# Patient Record
Sex: Female | Born: 1935 | Race: White | Hispanic: No | Marital: Married
Health system: Southern US, Community
[De-identification: ages and names within clinical notes are randomized; demographics above are authoritative.]

## PROBLEM LIST (undated history)

## (undated) DIAGNOSIS — J189 Pneumonia, unspecified organism: Secondary | ICD-10-CM

## (undated) DIAGNOSIS — S12000A Unspecified displaced fracture of first cervical vertebra, initial encounter for closed fracture: Secondary | ICD-10-CM

## (undated) DIAGNOSIS — T886XXD Anaphylactic reaction due to adverse effect of correct drug or medicament properly administered, subsequent encounter: Secondary | ICD-10-CM

## (undated) DIAGNOSIS — J9621 Acute and chronic respiratory failure with hypoxia: Secondary | ICD-10-CM

---

## 2019-12-08 ENCOUNTER — Institutional Professional Consult (permissible substitution) (HOSPITAL_COMMUNITY): Payer: Medicare Other

## 2019-12-08 ENCOUNTER — Other Ambulatory Visit (HOSPITAL_COMMUNITY): Payer: Medicare Other

## 2019-12-08 ENCOUNTER — Inpatient Hospital Stay
Admission: RE | Admit: 2019-12-08 | Discharge: 2020-01-01 | Disposition: A | Discharge status: Skilled Nursing Facility | Payer: Medicare Other | Source: Other Acute Inpatient Hospital | Attending: Internal Medicine | Admitting: Internal Medicine

## 2019-12-08 DIAGNOSIS — Z4659 Encounter for fitting and adjustment of other gastrointestinal appliance and device: Secondary | ICD-10-CM

## 2019-12-08 DIAGNOSIS — J189 Pneumonia, unspecified organism: Secondary | ICD-10-CM | POA: Diagnosis present

## 2019-12-08 DIAGNOSIS — Z9181 History of falling: Secondary | ICD-10-CM

## 2019-12-08 DIAGNOSIS — T886XXD Anaphylactic reaction due to adverse effect of correct drug or medicament properly administered, subsequent encounter: Secondary | ICD-10-CM | POA: Diagnosis present

## 2019-12-08 DIAGNOSIS — K819 Cholecystitis, unspecified: Secondary | ICD-10-CM

## 2019-12-08 DIAGNOSIS — Z978 Presence of other specified devices: Secondary | ICD-10-CM

## 2019-12-08 DIAGNOSIS — J9621 Acute and chronic respiratory failure with hypoxia: Secondary | ICD-10-CM | POA: Diagnosis present

## 2019-12-08 DIAGNOSIS — S12000A Unspecified displaced fracture of first cervical vertebra, initial encounter for closed fracture: Secondary | ICD-10-CM | POA: Diagnosis present

## 2019-12-08 HISTORY — DX: Unspecified displaced fracture of first cervical vertebra, initial encounter for closed fracture: S12.000A

## 2019-12-08 HISTORY — DX: Pneumonia, unspecified organism: J18.9

## 2019-12-08 HISTORY — DX: Acute and chronic respiratory failure with hypoxia: J96.21

## 2019-12-08 HISTORY — DX: Anaphylactic reaction due to adverse effect of correct drug or medicament properly administered, subsequent encounter: T88.6XXD

## 2019-12-09 LAB — CBC
HCT: 26.1 % — ABNORMAL LOW (ref 36.0–46.0)
Hemoglobin: 8.3 g/dL — ABNORMAL LOW (ref 12.0–15.0)
MCH: 32.4 pg (ref 26.0–34.0)
MCHC: 31.8 g/dL (ref 30.0–36.0)
MCV: 102 fL — ABNORMAL HIGH (ref 80.0–100.0)
Platelets: 320 10*3/uL (ref 150–400)
RBC: 2.56 MIL/uL — ABNORMAL LOW (ref 3.87–5.11)
RDW: 15.3 % (ref 11.5–15.5)
WBC: 5.9 10*3/uL (ref 4.0–10.5)
nRBC: 0 % (ref 0.0–0.2)

## 2019-12-09 LAB — BASIC METABOLIC PANEL
Anion gap: 9 (ref 5–15)
BUN: 22 mg/dL (ref 8–23)
CO2: 25 mmol/L (ref 22–32)
Calcium: 8.2 mg/dL — ABNORMAL LOW (ref 8.9–10.3)
Chloride: 106 mmol/L (ref 98–111)
Creatinine, Ser: 0.52 mg/dL (ref 0.44–1.00)
GFR calc Af Amer: >60 mL/min (ref >60)
GFR calc non Af Amer: >60 mL/min (ref >60)
Glucose, Bld: 103 mg/dL — ABNORMAL HIGH (ref 70–99)
Potassium: 3.8 mmol/L (ref 3.5–5.1)
Sodium: 140 mmol/L (ref 135–145)

## 2019-12-09 LAB — COMPREHENSIVE METABOLIC PANEL
ALT: 26 U/L (ref 0–44)
AST: 30 U/L (ref 15–41)
Albumin: 2.4 g/dL — ABNORMAL LOW (ref 3.5–5.0)
Alkaline Phosphatase: 113 U/L (ref 38–126)
Anion gap: 9 (ref 5–15)
BUN: 20 mg/dL (ref 8–23)
CO2: 26 mmol/L (ref 22–32)
Calcium: 8.5 mg/dL — ABNORMAL LOW (ref 8.9–10.3)
Chloride: 106 mmol/L (ref 98–111)
Creatinine, Ser: 0.85 mg/dL (ref 0.44–1.00)
GFR calc Af Amer: >60 mL/min (ref >60)
GFR calc non Af Amer: >60 mL/min (ref >60)
Glucose, Bld: 109 mg/dL — ABNORMAL HIGH (ref 70–99)
Potassium: 3.7 mmol/L (ref 3.5–5.1)
Sodium: 141 mmol/L (ref 135–145)
Total Bilirubin: 0.9 mg/dL (ref 0.3–1.2)
Total Protein: 4.7 g/dL — ABNORMAL LOW (ref 6.5–8.1)

## 2019-12-09 LAB — CBC WITH DIFFERENTIAL/PLATELET
Abs Immature Granulocytes: 0.02 10*3/uL (ref 0.00–0.07)
Basophils Absolute: 0.1 10*3/uL (ref 0.0–0.1)
Basophils Relative: 1 %
Eosinophils Absolute: 0.1 10*3/uL (ref 0.0–0.5)
Eosinophils Relative: 2 %
HCT: 27.3 % — ABNORMAL LOW (ref 36.0–46.0)
Hemoglobin: 8.5 g/dL — ABNORMAL LOW (ref 12.0–15.0)
Immature Granulocytes: 0 %
Lymphocytes Relative: 16 %
Lymphs Abs: 0.8 10*3/uL (ref 0.7–4.0)
MCH: 31.8 pg (ref 26.0–34.0)
MCHC: 31.1 g/dL (ref 30.0–36.0)
MCV: 102.2 fL — ABNORMAL HIGH (ref 80.0–100.0)
Monocytes Absolute: 0.6 10*3/uL (ref 0.1–1.0)
Monocytes Relative: 11 %
Neutro Abs: 3.5 10*3/uL (ref 1.7–7.7)
Neutrophils Relative %: 70 %
Platelets: 319 10*3/uL (ref 150–400)
RBC: 2.67 MIL/uL — ABNORMAL LOW (ref 3.87–5.11)
RDW: 15.1 % (ref 11.5–15.5)
WBC: 5.1 10*3/uL (ref 4.0–10.5)
nRBC: 0 % (ref 0.0–0.2)

## 2019-12-11 ENCOUNTER — Other Ambulatory Visit (HOSPITAL_COMMUNITY): Payer: Medicare Other

## 2019-12-11 ENCOUNTER — Encounter (HOSPITAL_COMMUNITY): Payer: Medicare Other | Admitting: Anesthesiology

## 2019-12-11 LAB — URINALYSIS, ROUTINE W REFLEX MICROSCOPIC
Bilirubin Urine: NEGATIVE
Glucose, UA: NEGATIVE mg/dL
Ketones, ur: NEGATIVE mg/dL
Nitrite: NEGATIVE
Protein, ur: 30 mg/dL — AB
Specific Gravity, Urine: 1.019 (ref 1.005–1.030)
WBC, UA: >50 WBC/hpf — ABNORMAL HIGH (ref 0–5)
pH: 5 (ref 5.0–8.0)

## 2019-12-11 LAB — BLOOD GAS, ARTERIAL
Acid-Base Excess: 0.3 mmol/L (ref 0.0–2.0)
Bicarbonate: 24.6 mmol/L (ref 20.0–28.0)
FIO2: 35
O2 Saturation: 98.5 %
Patient temperature: 36.3
pCO2 arterial: 39.7 mmHg (ref 32.0–48.0)
pH, Arterial: 7.405 (ref 7.350–7.450)
pO2, Arterial: 116 mmHg — ABNORMAL HIGH (ref 83.0–108.0)

## 2019-12-11 MED ORDER — NYSTATIN 100000 UNIT/ML MT SUSP
500000.00 | OROMUCOSAL | Status: DC
Start: 2019-12-09 — End: 2019-12-11

## 2019-12-11 MED ORDER — ACETAMINOPHEN 325 MG PO TABS
325.00 | ORAL_TABLET | ORAL | Status: DC
Start: 2019-12-09 — End: 2019-12-11

## 2019-12-11 MED ORDER — PROPOFOL 10 MG/ML IV BOLUS
INTRAVENOUS | Status: DC | PRN
  Administered 2019-12-11: 40 mg via INTRAVENOUS
  Administered 2019-12-11: 60 mg via INTRAVENOUS

## 2019-12-11 MED ORDER — PRO-STAT PO LIQD
ORAL | Status: DC
Start: 2019-12-09 — End: 2019-12-11

## 2019-12-11 MED ORDER — ASPIRIN 325 MG PO TABS
325.00 | ORAL_TABLET | ORAL | Status: DC
Start: 2019-12-09 — End: 2019-12-11

## 2019-12-11 MED ORDER — GENERIC EXTERNAL MEDICATION
Status: DC
Start: ? — End: 2019-12-11

## 2019-12-11 MED ORDER — CHOLECALCIFEROL 25 MCG (1000 UT) PO TABS
1000.00 | ORAL_TABLET | ORAL | Status: DC
Start: 2019-12-09 — End: 2019-12-11

## 2019-12-11 MED ORDER — TRAMADOL HCL 50 MG PO TABS
50.00 | ORAL_TABLET | ORAL | Status: DC
Start: ? — End: 2019-12-11

## 2019-12-11 MED ORDER — GENERIC EXTERNAL MEDICATION
12.50 | Status: DC
Start: 2019-12-08 — End: 2019-12-11

## 2019-12-11 MED ORDER — SENNOSIDES-DOCUSATE SODIUM 8.6-50 MG PO TABS
2.00 | ORAL_TABLET | ORAL | Status: DC
Start: 2019-12-08 — End: 2019-12-11

## 2019-12-11 MED ORDER — BACITRACIN ZINC 500 UNIT/GM EX OINT
TOPICAL_OINTMENT | CUTANEOUS | Status: DC
Start: 2019-12-08 — End: 2019-12-11

## 2019-12-11 MED ORDER — OXYCODONE-ACETAMINOPHEN 5-325 MG PO TABS
1.00 | ORAL_TABLET | ORAL | Status: DC
Start: ? — End: 2019-12-11

## 2019-12-11 MED ORDER — SUCCINYLCHOLINE CHLORIDE 20 MG/ML IJ SOLN
INTRAMUSCULAR | Status: DC | PRN
  Administered 2019-12-11: 100 mg via INTRAVENOUS

## 2019-12-11 MED ORDER — MELATONIN 3 MG PO TABS
3.00 | ORAL_TABLET | ORAL | Status: DC
Start: 2019-12-08 — End: 2019-12-11

## 2019-12-11 MED ORDER — ENOXAPARIN SODIUM 30 MG/0.3ML ~~LOC~~ SOLN
30.00 | SUBCUTANEOUS | Status: DC
Start: 2019-12-08 — End: 2019-12-11

## 2019-12-11 MED ORDER — METOPROLOL TARTRATE 5 MG/5ML IV SOLN
5.00 | INTRAVENOUS | Status: DC
Start: ? — End: 2019-12-11

## 2019-12-11 MED ORDER — POLYETHYLENE GLYCOL 3350 17 GM/SCOOP PO POWD
17.00 | ORAL | Status: DC
Start: 2019-12-09 — End: 2019-12-11

## 2019-12-11 MED ORDER — BISACODYL 10 MG RE SUPP
10.00 | RECTAL | Status: DC
Start: ? — End: 2019-12-11

## 2019-12-11 NOTE — Anesthesia Procedure Notes (Signed)
Procedure Name: Intubation Date/Time: 12/11/2019 3:04 AM Performed by: Suzy Bouchard, CRNA Pre-anesthesia Checklist: Patient identified, Emergency Drugs available, Suction available, Patient being monitored and Timeout performed Patient Re-evaluated:Patient Re-evaluated prior to induction Oxygen Delivery Method: Circle system utilized Preoxygenation: Pre-oxygenation with 100% oxygen Induction Type: IV induction and Rapid sequence Ventilation: Mask ventilation without difficulty Laryngoscope Size: Glidescope and 3 Grade View: Grade III Tube type: Oral Tube size: 7.5 mm Number of attempts: 4 Airway Equipment and Method: Stylet and Video-laryngoscopy Placement Confirmation: ETT inserted through vocal cords under direct vision,  CO2 detector and breath sounds checked- equal and bilateral Secured at: 23 cm Tube secured with: Tape Dental Injury: Teeth and Oropharynx as per pre-operative assessment  Difficulty Due To: Difficult Airway- due to limited oral opening, Difficult Airway- due to cervical collar and Difficult Airway- due to reduced neck mobility Comments: Asked to intubate Ms. Kem after a suspected allergic reaction.  Patient in cervical collar / s/p cervical surgery.  In-line manual stabilization held by RN/RT.  No neck mobility.  Several glidescope attempts w 4 blade by AAuston CRNA, no view.  Bagged patient with 100% Fi02 after two attempts; sats remained 95 and above.  Successfully intubated by Dr. Ola Spurr with glidescope 3, grade 3 view.  Placed on vent by RT.

## 2019-12-12 ENCOUNTER — Encounter: Payer: Self-pay | Admitting: Internal Medicine

## 2019-12-12 DIAGNOSIS — T886XXD Anaphylactic reaction due to adverse effect of correct drug or medicament properly administered, subsequent encounter: Secondary | ICD-10-CM | POA: Diagnosis present

## 2019-12-12 DIAGNOSIS — J9621 Acute and chronic respiratory failure with hypoxia: Secondary | ICD-10-CM | POA: Diagnosis present

## 2019-12-12 DIAGNOSIS — S12001D Unspecified nondisplaced fracture of first cervical vertebra, subsequent encounter for fracture with routine healing: Secondary | ICD-10-CM | POA: Diagnosis not present

## 2019-12-12 DIAGNOSIS — Z978 Presence of other specified devices: Secondary | ICD-10-CM

## 2019-12-12 DIAGNOSIS — J189 Pneumonia, unspecified organism: Secondary | ICD-10-CM | POA: Diagnosis not present

## 2019-12-12 DIAGNOSIS — S12000A Unspecified displaced fracture of first cervical vertebra, initial encounter for closed fracture: Secondary | ICD-10-CM | POA: Diagnosis present

## 2019-12-12 LAB — CBC
HCT: 26.1 % — ABNORMAL LOW (ref 36.0–46.0)
Hemoglobin: 8.2 g/dL — ABNORMAL LOW (ref 12.0–15.0)
MCH: 31.8 pg (ref 26.0–34.0)
MCHC: 31.4 g/dL (ref 30.0–36.0)
MCV: 101.2 fL — ABNORMAL HIGH (ref 80.0–100.0)
Platelets: 320 10*3/uL (ref 150–400)
RBC: 2.58 MIL/uL — ABNORMAL LOW (ref 3.87–5.11)
RDW: 15.2 % (ref 11.5–15.5)
WBC: 8.2 10*3/uL (ref 4.0–10.5)
nRBC: 0 % (ref 0.0–0.2)

## 2019-12-12 LAB — BASIC METABOLIC PANEL
Anion gap: 10 (ref 5–15)
BUN: 37 mg/dL — ABNORMAL HIGH (ref 8–23)
CO2: 25 mmol/L (ref 22–32)
Calcium: 8.4 mg/dL — ABNORMAL LOW (ref 8.9–10.3)
Chloride: 107 mmol/L (ref 98–111)
Creatinine, Ser: 0.67 mg/dL (ref 0.44–1.00)
GFR calc Af Amer: >60 mL/min (ref >60)
GFR calc non Af Amer: >60 mL/min (ref >60)
Glucose, Bld: 137 mg/dL — ABNORMAL HIGH (ref 70–99)
Potassium: 4.2 mmol/L (ref 3.5–5.1)
Sodium: 142 mmol/L (ref 135–145)

## 2019-12-12 NOTE — Consult Note (Signed)
[facility] [place]  PULMONARY SERVICE  Date of Service: 12/12/2019  PULMONARY CRITICAL CARE CONSULT   Dawn Figueroa  AYT:[id number]  DOB: [birth year]   DOA: 12/08/2019  Referring Physician: Merton Border, MD  HPI: Dawn Figueroa is a 85 y.o. female seen for follow up of Acute on Chronic Respiratory Failure.  Patient has multiple medical problems apparently suffered a fall and sustained multiple injuries including a C1 fracture neurosurgery saw the patient at that time with supportive recommended nonoperative therapy.  Patient however subsequently went to the OR and had an internal fixation of C1-2 fracture dislocation.  Postoperatively patient ended up getting extubated however had a pneumonia was treated with broad-spectrum antibiotics.  The patient appeared to do okay was transferred to this facility for further management and weaning.  Now overnight apparently patient has anaphylactic type reaction with throat swelling to some type of medication and had to be emergently intubated she is now on the ventilator but is doing better.  She is adequately sedated right now also has been switched over to pressure support to see if she is ready for weaning.  Review of Systems:  ROS performed and is unremarkable other than noted above.  Past medical history: Delirium Pneumonia Constipation  Past surgical history: Repair of the C1-2 fracture Intubation Ear laceration  Family history: Noncontributory  Social history: Unknown about tobacco alcohol or drug abuse  Medications: Reviewed on Rounds  Physical Exam:  Vitals: Temperature 97.9 pulse 97 respiratory 27 blood pressure 156/78 saturations 100%  Ventilator Settings mode ventilation pressure support FiO2 28% pressure 12 PEEP 5  . General: Comfortable at this time . Eyes: Grossly normal lids, irises & conjunctiva . ENT: grossly tongue is normal . Neck: no obvious mass . Cardiovascular: S1-S2  normal no gallop or rub . Respiratory: No rhonchi coarse breath sounds are noted . Abdomen: Soft and nontender at this time . Skin: no rash seen on limited exam . Musculoskeletal: not rigid . Psychiatric:unable to assess . Neurologic: no seizure no involuntary movements         Labs on Admission:  Basic Metabolic Panel: Recent Labs  Lab 12/08/19 2339 12/09/19 0548 12/12/19 0513  NA 140 141 142  K 3.8 3.7 4.2  CL 106 106 107  CO2 25 26 25   GLUCOSE 103* 109* 137*  BUN 22 20 37*  CREATININE 0.52 0.85 0.67  CALCIUM 8.2* 8.5* 8.4*    Recent Labs  Lab 12/11/19 0420  PHART 7.405  PCO2ART 39.7  PO2ART 116*  HCO3 24.6  O2SAT 98.5    Liver Function Tests: Recent Labs  Lab 12/09/19 0548  AST 30  ALT 26  ALKPHOS 113  BILITOT 0.9  PROT 4.7*  ALBUMIN 2.4*   No results for input(s): LIPASE, AMYLASE in the last 168 hours. No results for input(s): AMMONIA in the last 168 hours.  CBC: Recent Labs  Lab 12/08/19 2339 12/09/19 0548 12/12/19 0513  WBC 5.9 5.1 8.2  NEUTROABS  --  3.5  --   HGB 8.3* 8.5* 8.2*  HCT 26.1* 27.3* 26.1*  MCV 102.0* 102.2* 101.2*  PLT 320 319 320    Cardiac Enzymes: No results for input(s): CKTOTAL, CKMB, CKMBINDEX, TROPONINI in the last 168 hours.  BNP (last 3 results) No results for input(s): BNP in the last 8760 hours.  ProBNP (last 3 results) No results for input(s): PROBNP in the last 8760 hours.   Radiological Exams on Admission: DG CHEST PORT 1 VIEW  Result  Date: 12/11/2019 CLINICAL DATA:  Intubation EXAM: PORTABLE CHEST 1 VIEW COMPARISON:  12/08/2019 FINDINGS: Endotracheal tube tip is 2 cm above the inferior margin of the carina. Retraction by 2-3 cm would place it at the level of the clavicular heads. Orogastric tube courses below the field of view. No focal airspace consolidation. Cardiomediastinal contours are normal. IMPRESSION: Endotracheal tube tip 2 cm above the inferior margin of the carina. Retraction by 2-3 cm would  place it at the level of the clavicular heads. Electronically Signed   By: Deatra Robinson M.D.   On: 12/11/2019 03:40   DG CHEST PORT 1 VIEW  Result Date: 12/08/2019 CLINICAL DATA:  Status post fall. EXAM: PORTABLE CHEST 1 VIEW COMPARISON:  None. FINDINGS: Weighted enteric tube is in place, tip below the diaphragm not included in the field of view. The lungs are hyperinflated. Small bilateral pleural effusions. Minor left lung base atelectasis. Heart is normal in size. Normal mediastinal contours with aortic atherosclerosis. No pneumothorax. No pulmonary edema. No acute osseous abnormalities are seen. Chronic change of both shoulders. Bones are under mineralized. Skin staples in the cervical spine partially included. IMPRESSION: 1. Hyperinflation suggesting underlying COPD. 2. Small bilateral pleural effusions. 3.  Aortic Atherosclerosis (ICD10-I70.0). Electronically Signed   By: Narda Rutherford M.D.   On: 12/08/2019 23:07   DG Abd Portable 1V  Result Date: 12/08/2019 CLINICAL DATA:  Feeding tube placement EXAM: PORTABLE ABDOMEN - 1 VIEW COMPARISON:  None. FINDINGS: Feeding tube tip is in the descending duodenum. Nonobstructive bowel gas pattern. No organomegaly or free air. IMPRESSION: Feeding tube tip in the descending duodenum. Electronically Signed   By: Charlett Nose M.D.   On: 12/08/2019 22:03    Assessment/Plan Active Problems:   Acute on chronic respiratory failure with hypoxia (HCC)   Community acquired pneumonia   Anaphylactic reaction due to adverse effect of correct drug or medicament properly administered, subsequent encounter   Closed C1 fracture (HCC)   1. Acute on chronic respiratory failure hypoxia probably an acute anaphylactic reaction patient had to be intubated now is doing better so we are going to try to wean her sedation off currently is on propofol.  We will try to get her weaned and then now plan on extubation by morning 2. History of community-acquired pneumonia  treated at the other facility we will continue with supportive care last chest x-ray showing some hyperinflation small effusions 3. Anaphylaxis currently in recovery will continue to monitor closely hold off on extubation until we assess her again tomorrow. 4. C1-2 fracture status post surgical repair supportive care  I have personally seen and evaluated the patient, evaluated laboratory and imaging results, formulated the assessment and plan and placed orders.  Patient is critically ill in danger of cardiac arrest and death requiring complex sedation medications as well as high risk airway remains orally intubated The Patient requires high complexity decision making for assessment and support.  Case was discussed on Rounds with the Respiratory Therapy Staff Time Spent  Yevonne Pax, MD [facility] Pulmonary Critical Care Medicine Sleep Medicine

## 2019-12-13 DIAGNOSIS — T886XXD Anaphylactic reaction due to adverse effect of correct drug or medicament properly administered, subsequent encounter: Secondary | ICD-10-CM | POA: Diagnosis not present

## 2019-12-13 DIAGNOSIS — J9621 Acute and chronic respiratory failure with hypoxia: Secondary | ICD-10-CM | POA: Diagnosis not present

## 2019-12-13 DIAGNOSIS — J189 Pneumonia, unspecified organism: Secondary | ICD-10-CM | POA: Diagnosis not present

## 2019-12-13 DIAGNOSIS — S12001D Unspecified nondisplaced fracture of first cervical vertebra, subsequent encounter for fracture with routine healing: Secondary | ICD-10-CM | POA: Diagnosis not present

## 2019-12-13 LAB — URINE CULTURE: Culture: >=100000 — AB

## 2019-12-13 NOTE — Progress Notes (Signed)
Pulmonary Critical Care Medicine [place]   PULMONARY CRITICAL CARE SERVICE  PROGRESS NOTE  Date of Service: 12/13/2019  Dawn Figueroa  NLG:[id number]  DOB: 06-03-1931   DOA: 12/08/2019  Referring Physician: Merton Border, MD  HPI: Dawn Figueroa is a 85 y.o. female seen for follow up of Acute on Chronic Respiratory Failure.  Patient currently is on full support on pressure support is on mild 28% FiO2 with good saturations noted  Medications: Reviewed on Rounds  Physical Exam:  Vitals: Temperature 97.1 pulse 79 respiratory rate 17 blood pressure 138/67 saturations 100%  Ventilator Settings mode of ventilation pressure support FiO2 28% tidal volume is 500 pressure poor 12 PEEP 5  . General: Comfortable at this time . Eyes: Grossly normal lids, irises & conjunctiva . ENT: grossly tongue is normal . Neck: no obvious mass . Cardiovascular: S1 S2 normal no gallop . Respiratory: Scattered rhonchi expansion is equal . Abdomen: soft . Skin: no rash seen on limited exam . Musculoskeletal: not rigid . Psychiatric:unable to assess . Neurologic: no seizure no involuntary movements         Lab Data:   Basic Metabolic Panel: Recent Labs  Lab 12/08/19 2339 12/09/19 0548 12/12/19 0513  NA 140 141 142  K 3.8 3.7 4.2  CL 106 106 107  CO2 25 26 25   GLUCOSE 103* 109* 137*  BUN 22 20 37*  CREATININE 0.52 0.85 0.67  CALCIUM 8.2* 8.5* 8.4*    ABG: Recent Labs  Lab 12/11/19 0420  PHART 7.405  PCO2ART 39.7  PO2ART 116*  HCO3 24.6  O2SAT 98.5    Liver Function Tests: Recent Labs  Lab 12/09/19 0548  AST 30  ALT 26  ALKPHOS 113  BILITOT 0.9  PROT 4.7*  ALBUMIN 2.4*   No results for input(s): LIPASE, AMYLASE in the last 168 hours. No results for input(s): AMMONIA in the last 168 hours.  CBC: Recent Labs  Lab 12/08/19 2339 12/09/19 0548 12/12/19 0513  WBC 5.9 5.1 8.2  NEUTROABS  --  3.5  --   HGB 8.3* 8.5* 8.2*  HCT 26.1* 27.3* 26.1*  MCV  102.0* 102.2* 101.2*  PLT 320 319 320    Cardiac Enzymes: No results for input(s): CKTOTAL, CKMB, CKMBINDEX, TROPONINI in the last 168 hours.  BNP (last 3 results) No results for input(s): BNP in the last 8760 hours.  ProBNP (last 3 results) No results for input(s): PROBNP in the last 8760 hours.  Radiological Exams: No results found.  Assessment/Plan Active Problems:   Acute on chronic respiratory failure with hypoxia (HCC)   Community acquired pneumonia   Anaphylactic reaction due to adverse effect of correct drug or medicament properly administered, subsequent encounter   Closed C1 fracture ([place])   1. Acute on chronic respiratory failure with hypoxia patient has remained pressure support overnight is doing very well.  Spoke with respiratory therapy during rounds the plan will be to do a cuff leak test evaluation to see if the patient is ready for trial of extubation.  If the patient passes this test then we will proceed to extubate 2. Anaphylactic reaction clinically improved we will continue with supportive care 3. Community-acquired pneumonia treated resolved 4. C1 fracture at baseline supportive care   I have personally seen and evaluated the patient, evaluated laboratory and imaging results, formulated the assessment and plan and placed orders.  Time 35 minutes extended discussion with respiratory therapy The Patient requires high complexity decision making for assessment and support.  Case was discussed on Rounds with the Respiratory Therapy Staff  Allyne Gee, MD [facility] Pulmonary Critical Care Medicine Sleep Medicine

## 2019-12-14 DIAGNOSIS — J9621 Acute and chronic respiratory failure with hypoxia: Secondary | ICD-10-CM | POA: Diagnosis not present

## 2019-12-14 DIAGNOSIS — S12001D Unspecified nondisplaced fracture of first cervical vertebra, subsequent encounter for fracture with routine healing: Secondary | ICD-10-CM | POA: Diagnosis not present

## 2019-12-14 DIAGNOSIS — T886XXD Anaphylactic reaction due to adverse effect of correct drug or medicament properly administered, subsequent encounter: Secondary | ICD-10-CM | POA: Diagnosis not present

## 2019-12-14 DIAGNOSIS — J189 Pneumonia, unspecified organism: Secondary | ICD-10-CM | POA: Diagnosis not present

## 2019-12-14 NOTE — Progress Notes (Signed)
Pulmonary Critical Care Medicine [facility] [place]   PULMONARY CRITICAL CARE SERVICE  PROGRESS NOTE  Date of Service: 12/14/2019  Dawn Figueroa  TIW:[id number]  DOB: May 01, 1931   DOA: 12/08/2019  Referring Physician: Carron Curie, MD  HPI: Dawn Figueroa is a 85 y.o. female seen for follow up of Acute on Chronic Respiratory Failure.  Patient was seen at bedside I did a cuff leak test on her and it turns out that she has a size 7.5 ET tube and she also has a very small body habitus.  She did have good leak around the cough and so therefore we will plan on extubating her however she will need to be monitored closely  Medications: Reviewed on Rounds  Physical Exam:  Vitals: Temperature 97.4 pulse 72 respiratory rate 19 blood pressure 160/87 saturations 93%  Ventilator Settings mode ventilation pressure support FiO2 28% tidal volume was 590 pressure support 12 PEEP 5  . General: Comfortable at this time . Eyes: Grossly normal lids, irises & conjunctiva . ENT: grossly tongue is normal . Neck: no obvious mass . Cardiovascular: S1 S2 normal no gallop . Respiratory: Coarse breath sounds with some few scattered rhonchi . Abdomen: soft . Skin: no rash seen on limited exam . Musculoskeletal: not rigid . Psychiatric:unable to assess . Neurologic: no seizure no involuntary movements         Lab Data:   Basic Metabolic Panel: Recent Labs  Lab 12/08/19 2339 12/09/19 0548 12/12/19 0513  NA 140 141 142  K 3.8 3.7 4.2  CL 106 106 107  CO2 25 26 25   GLUCOSE 103* 109* 137*  BUN 22 20 37*  CREATININE 0.52 0.85 0.67  CALCIUM 8.2* 8.5* 8.4*    ABG: Recent Labs  Lab 12/11/19 0420  PHART 7.405  PCO2ART 39.7  PO2ART 116*  HCO3 24.6  O2SAT 98.5    Liver Function Tests: Recent Labs  Lab 12/09/19 0548  AST 30  ALT 26  ALKPHOS 113  BILITOT 0.9  PROT 4.7*  ALBUMIN 2.4*   No results for input(s): LIPASE, AMYLASE in the last 168 hours. No results for input(s):  AMMONIA in the last 168 hours.  CBC: Recent Labs  Lab 12/08/19 2339 12/09/19 0548 12/12/19 0513  WBC 5.9 5.1 8.2  NEUTROABS  --  3.5  --   HGB 8.3* 8.5* 8.2*  HCT 26.1* 27.3* 26.1*  MCV 102.0* 102.2* 101.2*  PLT 320 319 320    Cardiac Enzymes: No results for input(s): CKTOTAL, CKMB, CKMBINDEX, TROPONINI in the last 168 hours.  BNP (last 3 results) No results for input(s): BNP in the last 8760 hours.  ProBNP (last 3 results) No results for input(s): PROBNP in the last 8760 hours.  Radiological Exams: No results found.  Assessment/Plan Active Problems:   Acute on chronic respiratory failure with hypoxia (HCC)   Community acquired pneumonia   Anaphylactic reaction due to adverse effect of correct drug or medicament properly administered, subsequent encounter   Closed C1 fracture (HCC)   1. Acute on chronic respiratory failure hypoxia the plan is to continue with advancing the wean plan on extubation today I believe she has passed her cuff leak test because of the size of her ET tube it may not be optimal plate due to the large size of ET tube and small airways I think it is going to be sufficient she is able to produce leak speech around the endotracheal tube 2. Community-acquired pneumonia treated we will continue with supportive care  3. Anaphylactic reaction resolved 4. C1 fracture patient is at baseline we will continue to monitor   I have personally seen and evaluated the patient, evaluated laboratory and imaging results, formulated the assessment and plan and placed orders.  Time is 35 minutes at the bedside and the discussion on rounds The Patient requires high complexity decision making for assessment and support.  Case was discussed on Rounds with the Respiratory Therapy Staff  Allyne Gee, MD [facility] Pulmonary Critical Care Medicine Sleep Medicine

## 2019-12-16 LAB — CBC
HCT: 31.4 % — ABNORMAL LOW (ref 36.0–46.0)
Hemoglobin: 10 g/dL — ABNORMAL LOW (ref 12.0–15.0)
MCH: 31 pg (ref 26.0–34.0)
MCHC: 31.8 g/dL (ref 30.0–36.0)
MCV: 97.2 fL (ref 80.0–100.0)
Platelets: 372 K/uL (ref 150–400)
RBC: 3.23 MIL/uL — ABNORMAL LOW (ref 3.87–5.11)
RDW: 14.6 % (ref 11.5–15.5)
WBC: 6.4 K/uL (ref 4.0–10.5)
nRBC: 0 % (ref 0.0–0.2)

## 2019-12-16 LAB — BASIC METABOLIC PANEL
Anion gap: 11 (ref 5–15)
BUN: 19 mg/dL (ref 8–23)
CO2: 21 mmol/L — ABNORMAL LOW (ref 22–32)
Calcium: 8.6 mg/dL — ABNORMAL LOW (ref 8.9–10.3)
Chloride: 107 mmol/L (ref 98–111)
Creatinine, Ser: 0.54 mg/dL (ref 0.44–1.00)
GFR calc Af Amer: >60 mL/min (ref >60)
GFR calc non Af Amer: >60 mL/min (ref >60)
Glucose, Bld: 108 mg/dL — ABNORMAL HIGH (ref 70–99)
Potassium: 3.8 mmol/L (ref 3.5–5.1)
Sodium: 139 mmol/L (ref 135–145)

## 2019-12-20 ENCOUNTER — Other Ambulatory Visit (HOSPITAL_COMMUNITY): Payer: Medicare Other

## 2019-12-20 MED ORDER — GENERIC EXTERNAL MEDICATION
Status: DC
Start: ? — End: 2019-12-20

## 2019-12-23 LAB — CBC
HCT: 30.6 % — ABNORMAL LOW (ref 36.0–46.0)
Hemoglobin: 9.2 g/dL — ABNORMAL LOW (ref 12.0–15.0)
MCH: 30.1 pg (ref 26.0–34.0)
MCHC: 30.1 g/dL (ref 30.0–36.0)
MCV: 100 fL (ref 80.0–100.0)
Platelets: 302 10*3/uL (ref 150–400)
RBC: 3.06 MIL/uL — ABNORMAL LOW (ref 3.87–5.11)
RDW: 14.6 % (ref 11.5–15.5)
WBC: 5.7 10*3/uL (ref 4.0–10.5)
nRBC: 0 % (ref 0.0–0.2)

## 2019-12-23 LAB — COMPREHENSIVE METABOLIC PANEL
ALT: 15 U/L (ref 0–44)
AST: 18 U/L (ref 15–41)
Albumin: 2.3 g/dL — ABNORMAL LOW (ref 3.5–5.0)
Alkaline Phosphatase: 77 U/L (ref 38–126)
Anion gap: 9 (ref 5–15)
BUN: 50 mg/dL — ABNORMAL HIGH (ref 8–23)
CO2: 25 mmol/L (ref 22–32)
Calcium: 8.7 mg/dL — ABNORMAL LOW (ref 8.9–10.3)
Chloride: 109 mmol/L (ref 98–111)
Creatinine, Ser: 0.58 mg/dL (ref 0.44–1.00)
GFR calc Af Amer: >60 mL/min (ref >60)
GFR calc non Af Amer: >60 mL/min (ref >60)
Glucose, Bld: 94 mg/dL (ref 70–99)
Potassium: 4.3 mmol/L (ref 3.5–5.1)
Sodium: 143 mmol/L (ref 135–145)
Total Bilirubin: 0.3 mg/dL (ref 0.3–1.2)
Total Protein: 4.9 g/dL — ABNORMAL LOW (ref 6.5–8.1)

## 2019-12-30 LAB — CBC
HCT: 30.6 % — ABNORMAL LOW (ref 36.0–46.0)
Hemoglobin: 9.3 g/dL — ABNORMAL LOW (ref 12.0–15.0)
MCH: 29.1 pg (ref 26.0–34.0)
MCHC: 30.4 g/dL (ref 30.0–36.0)
MCV: 95.6 fL (ref 80.0–100.0)
Platelets: 286 10*3/uL (ref 150–400)
RBC: 3.2 MIL/uL — ABNORMAL LOW (ref 3.87–5.11)
RDW: 14.2 % (ref 11.5–15.5)
WBC: 3.2 10*3/uL — ABNORMAL LOW (ref 4.0–10.5)
nRBC: 0 % (ref 0.0–0.2)

## 2019-12-30 LAB — BASIC METABOLIC PANEL
Anion gap: 7 (ref 5–15)
BUN: 23 mg/dL (ref 8–23)
CO2: 28 mmol/L (ref 22–32)
Calcium: 8.9 mg/dL (ref 8.9–10.3)
Chloride: 109 mmol/L (ref 98–111)
Creatinine, Ser: 0.6 mg/dL (ref 0.44–1.00)
GFR calc Af Amer: >60 mL/min (ref >60)
GFR calc non Af Amer: >60 mL/min (ref >60)
Glucose, Bld: 105 mg/dL — ABNORMAL HIGH (ref 70–99)
Potassium: 4 mmol/L (ref 3.5–5.1)
Sodium: 144 mmol/L (ref 135–145)

## 2020-01-01 LAB — SARS CORONAVIRUS 2 (TAT 6-24 HRS): SARS Coronavirus 2: NEGATIVE

## 2021-04-22 IMAGING — DX DG CHEST 1V PORT
1 series · 1 of 1 positions shown · non-contrast
Comparison: None.

CLINICAL DATA: Status post fall.

EXAM:
PORTABLE CHEST 1 VIEW

[chest]
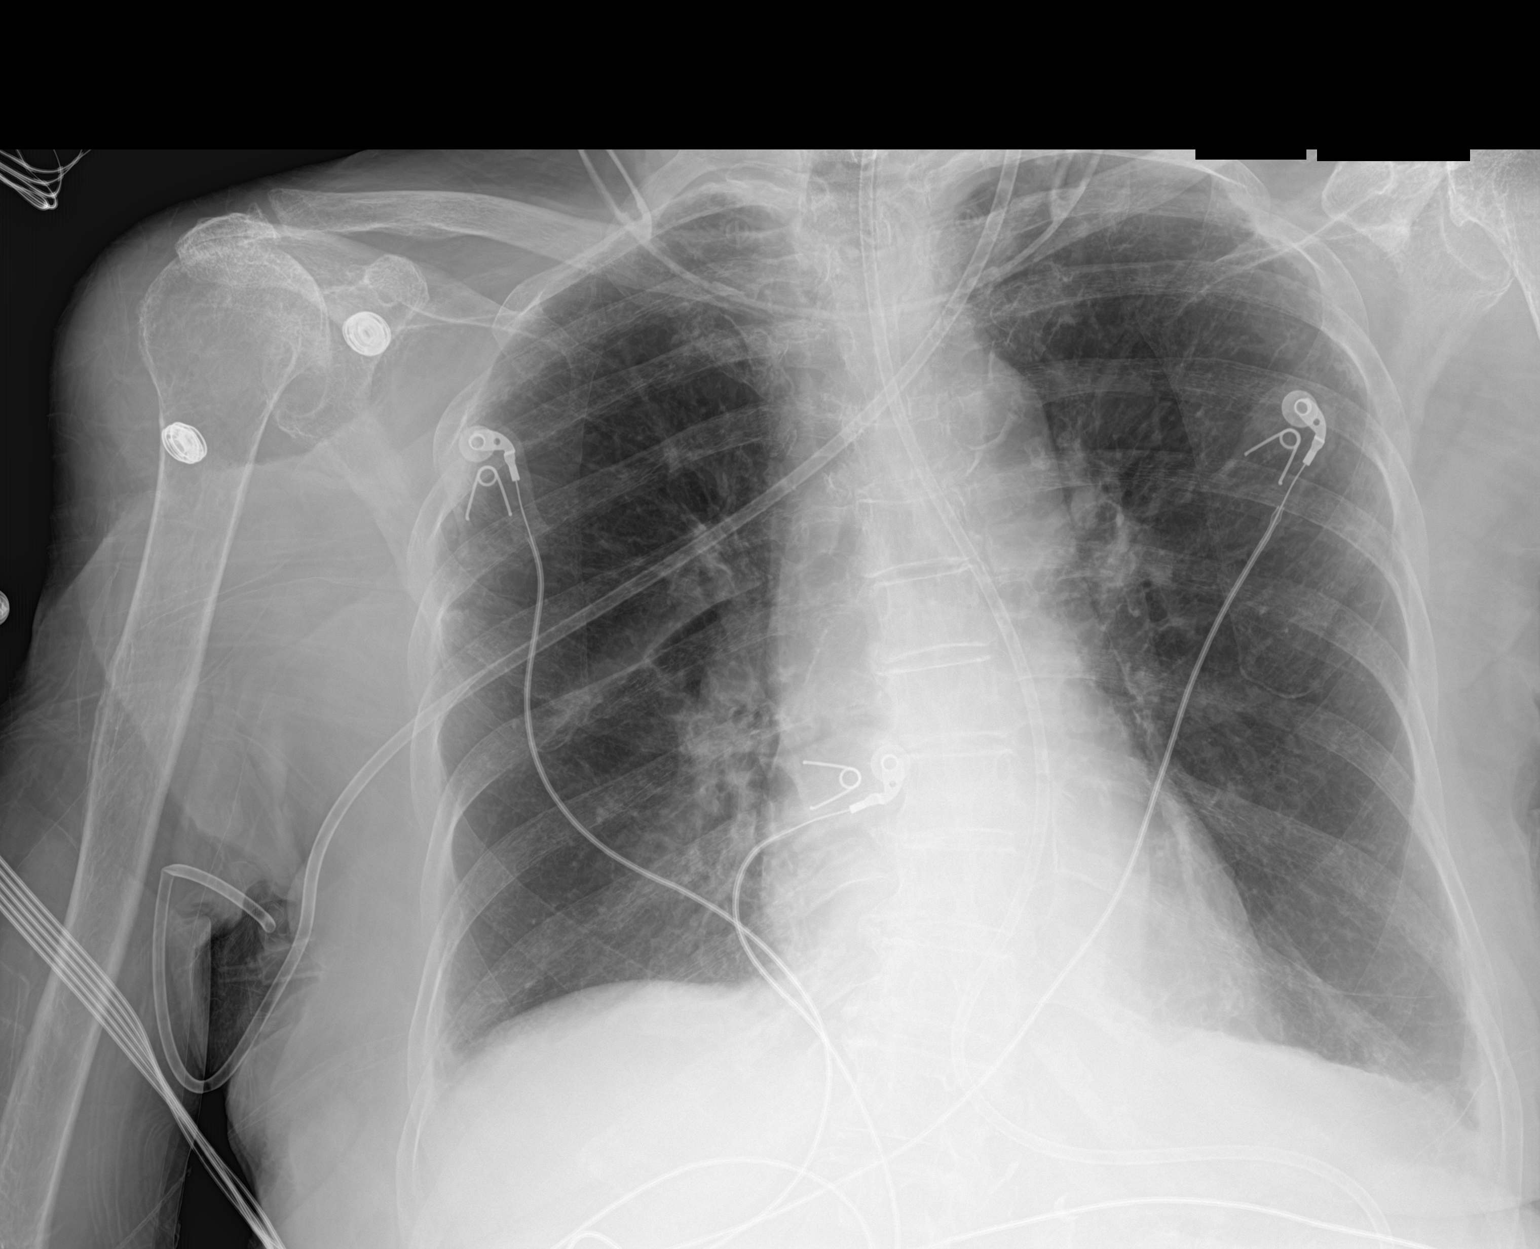

[1 of 1 positions shown; findings below may reference images not displayed]

FINDINGS: Weighted enteric tube is in place, tip below the diaphragm not
included in the field of view. The lungs are hyperinflated. Small
bilateral pleural effusions. Minor left lung base atelectasis. Heart
is normal in size. Normal mediastinal contours with aortic
atherosclerosis. No pneumothorax. No pulmonary edema. No acute
osseous abnormalities are seen. Chronic change of both shoulders.
Bones are under mineralized. Skin staples in the cervical spine
partially included.
IMPRESSION: 1. Hyperinflation suggesting underlying COPD.
2. Small bilateral pleural effusions.
3.  Aortic Atherosclerosis ([id number]).

## 2021-04-25 IMAGING — DX DG CHEST 1V PORT
1 series · 1 of 1 positions shown · non-contrast
Comparison: 12/08/2019

CLINICAL DATA: Intubation

EXAM:
PORTABLE CHEST 1 VIEW

[chest]
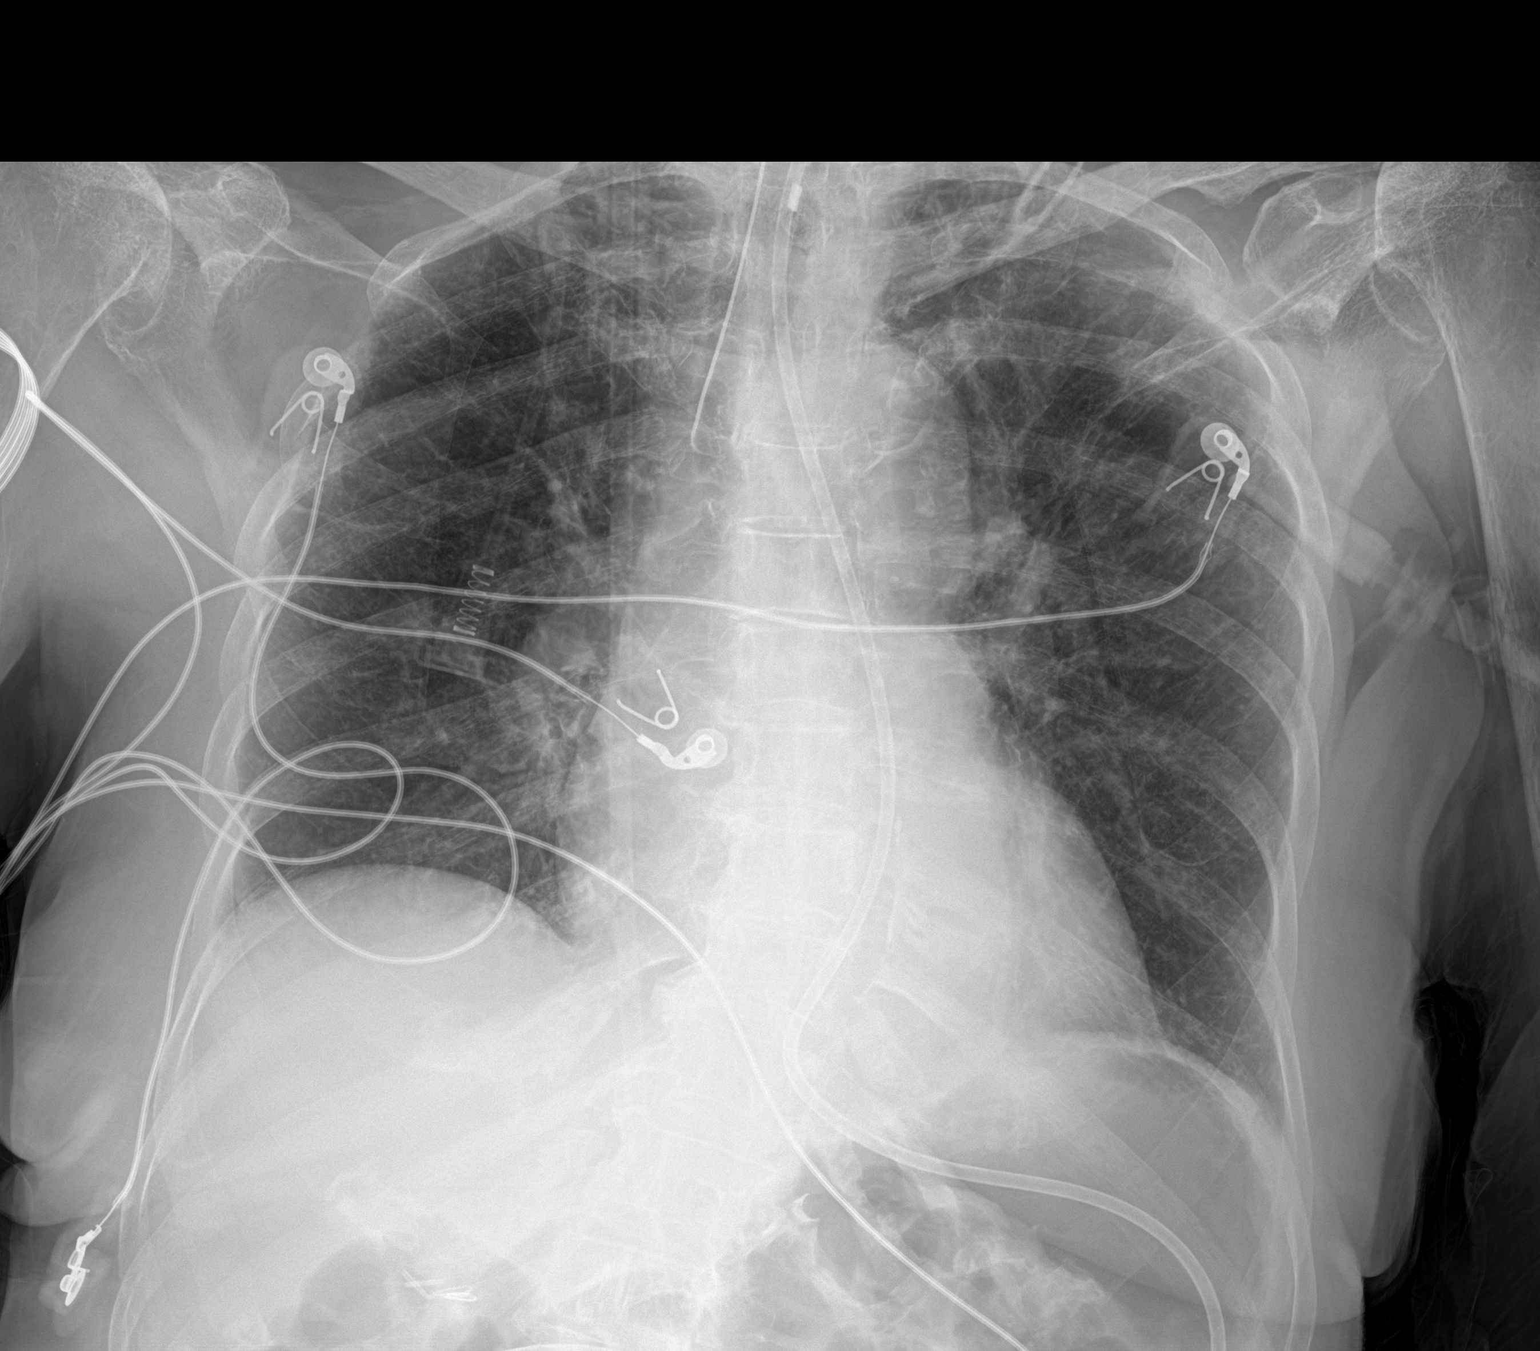

[1 of 1 positions shown; findings below may reference images not displayed]

FINDINGS: Endotracheal tube tip is 2 cm above the inferior margin of the
carina. Retraction by 2-3 cm would place it at the level of the
clavicular heads. Orogastric tube courses below the field of view.

No focal airspace consolidation. Cardiomediastinal contours are
normal.
IMPRESSION: Endotracheal tube tip 2 cm above the inferior margin of the carina.
Retraction by 2-3 cm would place it at the level of the clavicular
heads.
